# Patient Record
Sex: Female | Born: 2000 | Race: White | Hispanic: No | Marital: Single | Smoking: Never smoker
Health system: Southern US, Community
[De-identification: ages and names within clinical notes are randomized; demographics above are authoritative.]

## PROBLEM LIST (undated history)

## (undated) DIAGNOSIS — F909 Attention-deficit hyperactivity disorder, unspecified type: Secondary | ICD-10-CM

## (undated) HISTORY — PX: ULNAR NERVE TRANSPOSITION: SHX2595

## (undated) HISTORY — DX: Attention-deficit hyperactivity disorder, unspecified type: F90.9

## (undated) HISTORY — PX: KNEE SURGERY: SHX244

## (undated) HISTORY — PX: TONSILLECTOMY: SUR1361

---
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
## 2021-01-27 ENCOUNTER — Other Ambulatory Visit: Payer: Self-pay

## 2021-01-27 DIAGNOSIS — Z68.41 Body mass index (BMI) pediatric, 5th percentile to less than 85th percentile for age: Secondary | ICD-10-CM | POA: Diagnosis not present

## 2021-01-27 DIAGNOSIS — F902 Attention-deficit hyperactivity disorder, combined type: Secondary | ICD-10-CM | POA: Diagnosis not present

## 2021-01-27 MED ORDER — AMPHETAMINE-DEXTROAMPHET ER 20 MG PO CP24
20.0000 mg | ORAL_CAPSULE | Freq: Every morning | ORAL | 0 refills | Status: DC
  Filled 2021-01-27: qty 30, 30d supply, fill #0

## 2021-05-10 ENCOUNTER — Other Ambulatory Visit: Payer: Self-pay

## 2021-05-10 MED ORDER — AMPHETAMINE-DEXTROAMPHET ER 20 MG PO CP24
ORAL_CAPSULE | ORAL | 0 refills | Status: DC
  Filled 2021-05-10: qty 30, 30d supply, fill #0

## 2021-05-11 ENCOUNTER — Other Ambulatory Visit: Payer: Self-pay

## 2021-07-19 ENCOUNTER — Other Ambulatory Visit: Payer: Self-pay

## 2021-07-19 DIAGNOSIS — Z68.41 Body mass index (BMI) pediatric, 85th percentile to less than 95th percentile for age: Secondary | ICD-10-CM | POA: Diagnosis not present

## 2021-07-19 DIAGNOSIS — F902 Attention-deficit hyperactivity disorder, combined type: Secondary | ICD-10-CM | POA: Diagnosis not present

## 2021-07-19 DIAGNOSIS — Z23 Encounter for immunization: Secondary | ICD-10-CM | POA: Diagnosis not present

## 2021-07-19 MED ORDER — AMPHETAMINE-DEXTROAMPHET ER 20 MG PO CP24
ORAL_CAPSULE | ORAL | 0 refills | Status: DC
  Filled 2021-07-19: qty 30, 30d supply, fill #0

## 2021-08-19 DIAGNOSIS — J069 Acute upper respiratory infection, unspecified: Secondary | ICD-10-CM | POA: Diagnosis not present

## 2021-08-19 DIAGNOSIS — R059 Cough, unspecified: Secondary | ICD-10-CM | POA: Diagnosis not present

## 2021-08-19 DIAGNOSIS — J029 Acute pharyngitis, unspecified: Secondary | ICD-10-CM | POA: Diagnosis not present

## 2021-08-19 DIAGNOSIS — U071 COVID-19: Secondary | ICD-10-CM | POA: Diagnosis not present

## 2021-09-07 DIAGNOSIS — H5213 Myopia, bilateral: Secondary | ICD-10-CM | POA: Diagnosis not present

## 2021-10-06 ENCOUNTER — Other Ambulatory Visit: Payer: Self-pay

## 2021-10-06 MED ORDER — AMPHETAMINE-DEXTROAMPHET ER 20 MG PO CP24
ORAL_CAPSULE | ORAL | 0 refills | Status: DC
  Filled 2021-10-06: qty 30, 30d supply, fill #0

## 2021-10-07 DIAGNOSIS — Z68.41 Body mass index (BMI) pediatric, 85th percentile to less than 95th percentile for age: Secondary | ICD-10-CM | POA: Diagnosis not present

## 2021-10-07 DIAGNOSIS — Z0000 Encounter for general adult medical examination without abnormal findings: Secondary | ICD-10-CM | POA: Diagnosis not present

## 2021-10-07 DIAGNOSIS — F909 Attention-deficit hyperactivity disorder, unspecified type: Secondary | ICD-10-CM | POA: Diagnosis not present

## 2021-10-07 DIAGNOSIS — Z23 Encounter for immunization: Secondary | ICD-10-CM | POA: Diagnosis not present

## 2021-10-07 DIAGNOSIS — Z713 Dietary counseling and surveillance: Secondary | ICD-10-CM | POA: Diagnosis not present

## 2021-10-07 DIAGNOSIS — Z113 Encounter for screening for infections with a predominantly sexual mode of transmission: Secondary | ICD-10-CM | POA: Diagnosis not present

## 2022-01-02 ENCOUNTER — Other Ambulatory Visit: Payer: Self-pay

## 2022-01-02 MED ORDER — AMPHETAMINE-DEXTROAMPHET ER 20 MG PO CP24
ORAL_CAPSULE | ORAL | 0 refills | Status: AC
  Filled 2022-01-02: qty 30, 30d supply, fill #0

## 2022-01-10 ENCOUNTER — Other Ambulatory Visit: Payer: Self-pay

## 2022-01-10 DIAGNOSIS — F902 Attention-deficit hyperactivity disorder, combined type: Secondary | ICD-10-CM | POA: Diagnosis not present

## 2022-01-10 DIAGNOSIS — N62 Hypertrophy of breast: Secondary | ICD-10-CM | POA: Diagnosis not present

## 2022-01-10 DIAGNOSIS — Z68.41 Body mass index (BMI) pediatric, 5th percentile to less than 85th percentile for age: Secondary | ICD-10-CM | POA: Diagnosis not present

## 2022-02-06 ENCOUNTER — Encounter: Payer: Self-pay | Admitting: Plastic Surgery

## 2022-02-06 ENCOUNTER — Ambulatory Visit: Payer: 59 | Admitting: Plastic Surgery

## 2022-02-06 DIAGNOSIS — M546 Pain in thoracic spine: Secondary | ICD-10-CM | POA: Diagnosis not present

## 2022-02-06 DIAGNOSIS — M545 Low back pain, unspecified: Secondary | ICD-10-CM | POA: Diagnosis not present

## 2022-02-06 DIAGNOSIS — N62 Hypertrophy of breast: Secondary | ICD-10-CM

## 2022-02-06 DIAGNOSIS — Z6826 Body mass index (BMI) 26.0-26.9, adult: Secondary | ICD-10-CM | POA: Diagnosis not present

## 2022-02-06 DIAGNOSIS — M542 Cervicalgia: Secondary | ICD-10-CM | POA: Diagnosis not present

## 2022-02-06 DIAGNOSIS — Z803 Family history of malignant neoplasm of breast: Secondary | ICD-10-CM | POA: Diagnosis not present

## 2022-02-06 DIAGNOSIS — M549 Dorsalgia, unspecified: Secondary | ICD-10-CM | POA: Insufficient documentation

## 2022-02-06 DIAGNOSIS — R21 Rash and other nonspecific skin eruption: Secondary | ICD-10-CM

## 2022-02-06 DIAGNOSIS — G8929 Other chronic pain: Secondary | ICD-10-CM

## 2022-02-06 NOTE — Progress Notes (Signed)
Patient ID: Sierra Gilmore, female    DOB: 02-23-2001, 21 y.o.   MRN: [id number]   Chief Complaint  Patient presents with   Advice Only   Breast Problem    Mammary Hyperplasia: The patient is a 21 y.o. female with a history of mammary hyperplasia for several years.  She has extremely large breasts causing symptoms that include the following: Back pain in the upper and lower back, including neck pain. She pulls or pins her bra straps to provide better lift and relief of the pressure and pain. She notices relief by holding her breast up manually.  Her shoulder straps cause grooves and pain and pressure that requires padding for relief. Pain medication is sometimes required with motrin and tylenol.  Activities that are hindered by enlarged breasts include: exercise and running.  She has tried supportive clothing as well as fitted bras without improvement.  Her breasts are extremely large and fairly symmetric with the right slightly smaller.  She has hyperpigmentation of the inframammary area on both sides.  The sternal to nipple distance on the right is 30 cm and the left is 32 cm.  The IMF distance is 17 cm.  She is 5 feet 6 inches tall and weighs 166 pounds.  The BMI = 26.8 kg/m.  Preoperative bra size = I cup. She would like to be a C cup.  The estimated excess breast tissue to be removed at the time of surgery = 450-480 grams on the left and 450-480 grams on the right.  Mammogram history: none.  Family history of breast cancer:  p. grandmother.  Tobacco use:  none.   The patient expresses the desire to pursue surgical intervention.    Review of Systems  Constitutional: Negative.   HENT: Negative.    Eyes: Negative.   Respiratory: Negative.    Cardiovascular: Negative.   Gastrointestinal: Negative.   Endocrine: Negative.   Genitourinary: Negative.   Musculoskeletal:  Positive for back pain and neck pain.  Skin:  Positive for rash.  Neurological: Negative.   Hematological: Negative.    Psychiatric/Behavioral: Negative.      Past Medical History:  Diagnosis Date   ADHD (attention deficit hyperactivity disorder)     History reviewed. No pertinent surgical history.    Current Outpatient Medications:    amphetamine-dextroamphetamine (ADDERALL XR) 20 MG 24 hr capsule, Take 1 cap by mouth every morning for 30 days, Disp: 30 capsule, Rfl: 0   amphetamine-dextroamphetamine (ADDERALL XR) 20 MG 24 hr capsule, TAKE 1 CAPSULE BY MOUTH ONCE DAILY EVERY MORNING, Disp: 30 capsule, Rfl: 0   Objective:   Vitals:   02/06/22 1559  BP: 119/81  Pulse: 66  SpO2: 99%    Physical Exam Vitals and nursing note reviewed.  Constitutional:      Appearance: Normal appearance.  HENT:     Head: Normocephalic and atraumatic.  Cardiovascular:     Rate and Rhythm: Normal rate.     Pulses: Normal pulses.  Pulmonary:     Effort: Pulmonary effort is normal.  Abdominal:     General: There is no distension.     Palpations: Abdomen is soft.     Tenderness: There is no abdominal tenderness.  Musculoskeletal:        General: Tenderness present. No swelling or deformity.  Skin:    Capillary Refill: Capillary refill takes less than 2 seconds.     Coloration: Skin is not jaundiced or pale.     Findings: Rash  present. No bruising.  Neurological:     Mental Status: She is alert and oriented to person, place, and time.  Psychiatric:        Mood and Affect: Mood normal.        Behavior: Behavior normal.        Thought Content: Thought content normal.        Judgment: Judgment normal.     Assessment & Plan:  Symptomatic mammary hypertrophy  Neck pain  Chronic bilateral thoracic back pain  The procedure the patient selected and that was best for the patient was discussed. The risk were discussed and include but not limited to the following:  Breast asymmetry, fluid accumulation, firmness of the breast, inability to breast feed, loss of nipple or areola, skin loss, change in skin and  nipple sensation, fat necrosis of the breast tissue, bleeding, infection and healing delay.  There are risks of anesthesia and injury to nerves or blood vessels.  Allergic reaction to tape, suture and skin glue are possible.  There will be swelling.  Any of these can lead to the need for revisional surgery.  A breast reduction has potential to interfere with diagnostic procedures in the future.  This procedure is best done when the breast is fully developed.  Changes in the breast will continue to occur over time: pregnancy, weight gain or weigh loss.    Total time: 40 minutes. This includes time spent with the patient during the visit as well as time spent before and after the visit reviewing the chart, documenting the encounter, ordering pertinent studies and literature for the patient.    The patient is a good candidate for bilateral breast reduction with liposuction.   Pictures were obtained of the patient and placed in the chart with the patient's or guardian's permission.   Alena Bills Malayiah Mcbrayer, DO

## 2022-02-07 ENCOUNTER — Institutional Professional Consult (permissible substitution): Payer: 59 | Admitting: Plastic Surgery

## 2022-02-21 ENCOUNTER — Telehealth: Payer: Self-pay

## 2022-02-21 NOTE — Telephone Encounter (Signed)
Refaxed documents for surgery pre-authorization [phone]

## 2022-02-23 ENCOUNTER — Telehealth: Payer: Self-pay

## 2022-02-23 NOTE — Telephone Encounter (Signed)
Refaxed notes, photos to [facility] ref#[id number]

## 2022-03-08 ENCOUNTER — Telehealth: Payer: Self-pay | Admitting: Plastic Surgery

## 2022-03-08 NOTE — Telephone Encounter (Signed)
TRLVM NEED TO SCHEDULE SURGERY WAS APPROVED - [facility] NEEDS TO COORDINATE TIME OFF---POSS SEPT 18 OR 21 OR OCT 2??

## 2022-09-15 DIAGNOSIS — M25521 Pain in right elbow: Secondary | ICD-10-CM | POA: Diagnosis not present

## 2022-09-20 DIAGNOSIS — G5621 Lesion of ulnar nerve, right upper limb: Secondary | ICD-10-CM | POA: Diagnosis not present

## 2022-10-09 DIAGNOSIS — G5621 Lesion of ulnar nerve, right upper limb: Secondary | ICD-10-CM | POA: Diagnosis not present

## 2022-10-17 DIAGNOSIS — H6992 Unspecified Eustachian tube disorder, left ear: Secondary | ICD-10-CM | POA: Diagnosis not present

## 2022-10-17 DIAGNOSIS — J0140 Acute pansinusitis, unspecified: Secondary | ICD-10-CM | POA: Diagnosis not present

## 2022-10-19 DIAGNOSIS — M25521 Pain in right elbow: Secondary | ICD-10-CM | POA: Diagnosis not present

## 2022-10-26 DIAGNOSIS — G5621 Lesion of ulnar nerve, right upper limb: Secondary | ICD-10-CM | POA: Diagnosis not present

## 2022-10-31 ENCOUNTER — Encounter (HOSPITAL_BASED_OUTPATIENT_CLINIC_OR_DEPARTMENT_OTHER): Payer: Self-pay | Admitting: Orthopedic Surgery

## 2022-10-31 ENCOUNTER — Other Ambulatory Visit: Payer: Self-pay

## 2022-11-05 NOTE — H&P (Signed)
Preoperative History & Physical Exam  Surgeon: Philipp Ovens, MD  Diagnosis: Right recurrent cubital tunnel syndrome  Planned Procedure: Procedure(s) (LRB): RIGHT REVISION ULNAR NERVE TRANSPOSITION AND FLEXOR PRONATOR MASS LENGTHENING (Right)  History of Present Illness:   Patient is a 22 y.o. female with symptoms consistent with Right recurrent cubital tunnel syndrome who presents for surgical intervention. The risks, benefits and alternatives of surgical intervention were discussed and informed consent was obtained prior to surgery.  Past Medical History:  Past Medical History:  Diagnosis Date   ADHD (attention deficit hyperactivity disorder)     Past Surgical History:  Past Surgical History:  Procedure Laterality Date   KNEE SURGERY     TONSILLECTOMY     ULNAR NERVE TRANSPOSITION      Medications:  Prior to Admission medications   Medication Sig Start Date End Date Taking? Authorizing Provider  amphetamine-dextroamphetamine (ADDERALL XR) 20 MG 24 hr capsule Take 1 cap by mouth every morning for 30 days 01/02/22  Yes   amphetamine-dextroamphetamine (ADDERALL XR) 20 MG 24 hr capsule TAKE 1 CAPSULE BY MOUTH ONCE DAILY EVERY MORNING 07/29/20 01/25/21  Gildardo Pounds, MD    Allergies:  Patient has no known allergies.  Review of Systems: Negative except per HPI.  Physical Exam: Alert and oriented, NAD Head and neck: no masses, normal alignment CV: pulse intact Pulm: no increased work of breathing, respirations even and unlabored Abdomen: non-distended Extremities: extremities warm and well perfused  LABS: No results found for this or any previous visit (from the past 2160 hour(s)).   Complete History and Physical exam available in the office notes  Gomez Cleverly

## 2022-11-06 NOTE — Anesthesia Preprocedure Evaluation (Addendum)
Anesthesia Evaluation  Patient identified by MRN, date of birth, ID band Patient awake    Reviewed: Allergy & Precautions, H&P , NPO status , Patient's Chart, lab work & pertinent test results  Airway Mallampati: II  TM Distance: >3 FB Neck ROM: Full    Dental no notable dental hx. (+) Teeth Intact, Dental Advisory Given   Pulmonary neg pulmonary ROS   Pulmonary exam normal breath sounds clear to auscultation       Cardiovascular Exercise Tolerance: Good negative cardio ROS Normal cardiovascular exam Rhythm:Regular Rate:Normal     Neuro/Psych  PSYCHIATRIC DISORDERS      negative neurological ROS  negative psych ROS   GI/Hepatic negative GI ROS, Neg liver ROS,,,  Endo/Other  negative endocrine ROS    Renal/GU negative Renal ROS  negative genitourinary   Musculoskeletal negative musculoskeletal ROS (+)    Abdominal   Peds negative pediatric ROS (+)  Hematology negative hematology ROS (+)   Anesthesia Other Findings   Reproductive/Obstetrics negative OB ROS                             Anesthesia Physical Anesthesia Plan  ASA: 2  Anesthesia Plan: General   Post-op Pain Management: Minimal or no pain anticipated, Tylenol PO (pre-op)* and Celebrex PO (pre-op)*   Induction: Intravenous  PONV Risk Score and Plan: 3 and Ondansetron, Treatment may vary due to age or medical condition and Propofol infusion  Airway Management Planned: LMA  Additional Equipment: None  Intra-op Plan:   Post-operative Plan:   Informed Consent: I have reviewed the patients History and Physical, chart, labs and discussed the procedure including the risks, benefits and alternatives for the proposed anesthesia with the patient or authorized representative who has indicated his/her understanding and acceptance.       Plan Discussed with: Anesthesiologist and CRNA  Anesthesia Plan Comments:         Anesthesia Quick Evaluation

## 2022-11-07 ENCOUNTER — Ambulatory Visit (HOSPITAL_BASED_OUTPATIENT_CLINIC_OR_DEPARTMENT_OTHER): Payer: Commercial Managed Care - PPO | Admitting: Anesthesiology

## 2022-11-07 ENCOUNTER — Ambulatory Visit (HOSPITAL_BASED_OUTPATIENT_CLINIC_OR_DEPARTMENT_OTHER): Payer: Commercial Managed Care - PPO

## 2022-11-07 ENCOUNTER — Other Ambulatory Visit: Payer: Self-pay

## 2022-11-07 ENCOUNTER — Encounter (HOSPITAL_BASED_OUTPATIENT_CLINIC_OR_DEPARTMENT_OTHER)
Admission: RE | Disposition: A | Discharge status: Home / Self Care | Payer: Self-pay | Source: Home / Self Care | Attending: Orthopedic Surgery

## 2022-11-07 ENCOUNTER — Encounter (HOSPITAL_BASED_OUTPATIENT_CLINIC_OR_DEPARTMENT_OTHER): Payer: Self-pay | Admitting: Orthopedic Surgery

## 2022-11-07 ENCOUNTER — Ambulatory Visit (HOSPITAL_BASED_OUTPATIENT_CLINIC_OR_DEPARTMENT_OTHER)
Admission: RE | Admit: 2022-11-07 | Discharge: 2022-11-07 | Disposition: A | Discharge status: Home / Self Care | Payer: Commercial Managed Care - PPO | Attending: Orthopedic Surgery | Admitting: Orthopedic Surgery

## 2022-11-07 DIAGNOSIS — G5621 Lesion of ulnar nerve, right upper limb: Secondary | ICD-10-CM | POA: Diagnosis not present

## 2022-11-07 DIAGNOSIS — Z01818 Encounter for other preprocedural examination: Secondary | ICD-10-CM

## 2022-11-07 HISTORY — PX: ANTERIOR INTEROSSEOUS NERVE DECOMPRESSION: SHX5735

## 2022-11-07 LAB — POCT PREGNANCY, URINE: Preg Test, Ur: NEGATIVE

## 2022-11-07 SURGERY — ANTERIOR INTEROSSEOUS NERVE DECOMPRESSION
Anesthesia: General | Site: Arm Upper | Laterality: Right

## 2022-11-07 MED ORDER — ACETAMINOPHEN 160 MG/5ML PO SOLN: 325.0000 mg | ORAL | Status: DC | PRN

## 2022-11-07 MED ORDER — MIDAZOLAM HCL 2 MG/2ML IJ SOLN
INTRAMUSCULAR | Status: AC
  Filled 2022-11-07: qty 2

## 2022-11-07 MED ORDER — DEXMEDETOMIDINE HCL IN NACL 80 MCG/20ML IV SOLN
INTRAVENOUS | Status: DC | PRN
  Administered 2022-11-07: 8 ug via BUCCAL

## 2022-11-07 MED ORDER — PROPOFOL 500 MG/50ML IV EMUL
INTRAVENOUS | Status: DC | PRN
  Administered 2022-11-07: 100 ug/kg/min via INTRAVENOUS

## 2022-11-07 MED ORDER — CELECOXIB 200 MG PO CAPS
200.0000 mg | ORAL_CAPSULE | Freq: Once | ORAL | Status: AC
  Administered 2022-11-07: 200 mg via ORAL

## 2022-11-07 MED ORDER — DEXAMETHASONE SODIUM PHOSPHATE 10 MG/ML IJ SOLN
INTRAMUSCULAR | Status: AC
  Filled 2022-11-07: qty 2

## 2022-11-07 MED ORDER — FENTANYL CITRATE (PF) 100 MCG/2ML IJ SOLN: 25.0000 ug | INTRAMUSCULAR | Status: DC | PRN

## 2022-11-07 MED ORDER — HYDROCODONE-ACETAMINOPHEN 5-325 MG PO TABS
1.0000 | ORAL_TABLET | Freq: Four times a day (QID) | ORAL | 0 refills | Status: AC | PRN
  Filled 2022-11-07: qty 8, 2d supply, fill #0

## 2022-11-07 MED ORDER — 0.9 % SODIUM CHLORIDE (POUR BTL) OPTIME
TOPICAL | Status: DC | PRN
  Administered 2022-11-07: 200 mL

## 2022-11-07 MED ORDER — BUPIVACAINE LIPOSOME 1.3 % IJ SUSP
INTRAMUSCULAR | Status: DC | PRN
  Administered 2022-11-07: 10 mL via PERINEURAL

## 2022-11-07 MED ORDER — ACETAMINOPHEN 325 MG PO TABS: 325.0000 mg | ORAL_TABLET | ORAL | Status: DC | PRN

## 2022-11-07 MED ORDER — LIDOCAINE-EPINEPHRINE 1 %-1:100000 IJ SOLN
INTRAMUSCULAR | Status: AC
  Filled 2022-11-07: qty 1

## 2022-11-07 MED ORDER — OXYCODONE HCL 5 MG/5ML PO SOLN: 5.0000 mg | Freq: Once | ORAL | Status: DC | PRN

## 2022-11-07 MED ORDER — FENTANYL CITRATE (PF) 100 MCG/2ML IJ SOLN
INTRAMUSCULAR | Status: AC
  Filled 2022-11-07: qty 2

## 2022-11-07 MED ORDER — BACITRACIN ZINC 500 UNIT/GM EX OINT
TOPICAL_OINTMENT | CUTANEOUS | Status: DC | PRN
  Administered 2022-11-07: 1 via TOPICAL

## 2022-11-07 MED ORDER — FENTANYL CITRATE (PF) 100 MCG/2ML IJ SOLN
100.0000 ug | Freq: Once | INTRAMUSCULAR | Status: AC
  Administered 2022-11-07: 100 ug via INTRAVENOUS

## 2022-11-07 MED ORDER — LIDOCAINE 2% (20 MG/ML) 5 ML SYRINGE
INTRAMUSCULAR | Status: DC | PRN
  Administered 2022-11-07: 30 mg via INTRAVENOUS

## 2022-11-07 MED ORDER — OXYCODONE HCL 5 MG PO TABS: 5.0000 mg | ORAL_TABLET | Freq: Once | ORAL | Status: DC | PRN

## 2022-11-07 MED ORDER — ONDANSETRON HCL 4 MG/2ML IJ SOLN
INTRAMUSCULAR | Status: AC
  Filled 2022-11-07: qty 10

## 2022-11-07 MED ORDER — LACTATED RINGERS IV SOLN: INTRAVENOUS | Status: DC

## 2022-11-07 MED ORDER — CEFAZOLIN SODIUM-DEXTROSE 2-4 GM/100ML-% IV SOLN
2.0000 g | INTRAVENOUS | Status: AC
  Administered 2022-11-07: 2 g via INTRAVENOUS

## 2022-11-07 MED ORDER — ACETAMINOPHEN 500 MG PO TABS
1000.0000 mg | ORAL_TABLET | Freq: Once | ORAL | Status: AC
  Administered 2022-11-07: 1000 mg via ORAL

## 2022-11-07 MED ORDER — ACETAMINOPHEN 500 MG PO TABS
ORAL_TABLET | ORAL | Status: AC
  Filled 2022-11-07: qty 2

## 2022-11-07 MED ORDER — CEFAZOLIN SODIUM-DEXTROSE 2-4 GM/100ML-% IV SOLN
INTRAVENOUS | Status: AC
  Filled 2022-11-07: qty 100

## 2022-11-07 MED ORDER — PROPOFOL 10 MG/ML IV BOLUS
INTRAVENOUS | Status: DC | PRN
  Administered 2022-11-07: 20 mg via INTRAVENOUS

## 2022-11-07 MED ORDER — BUPIVACAINE HCL (PF) 0.5 % IJ SOLN
INTRAMUSCULAR | Status: DC | PRN
  Administered 2022-11-07: 10 mL via PERINEURAL

## 2022-11-07 MED ORDER — ONDANSETRON HCL 4 MG/2ML IJ SOLN
INTRAMUSCULAR | Status: AC
  Filled 2022-11-07: qty 2

## 2022-11-07 MED ORDER — ONDANSETRON HCL 4 MG/2ML IJ SOLN: 4.0000 mg | Freq: Once | INTRAMUSCULAR | Status: DC | PRN

## 2022-11-07 MED ORDER — BUPIVACAINE HCL (PF) 0.5 % IJ SOLN
INTRAMUSCULAR | Status: AC
  Filled 2022-11-07: qty 30

## 2022-11-07 MED ORDER — BACITRACIN ZINC 500 UNIT/GM EX OINT
TOPICAL_OINTMENT | CUTANEOUS | Status: AC
  Filled 2022-11-07: qty 28.35

## 2022-11-07 MED ORDER — LIDOCAINE 2% (20 MG/ML) 5 ML SYRINGE
INTRAMUSCULAR | Status: AC
  Filled 2022-11-07: qty 20

## 2022-11-07 MED ORDER — MIDAZOLAM HCL 2 MG/2ML IJ SOLN
2.0000 mg | Freq: Once | INTRAMUSCULAR | Status: AC
  Administered 2022-11-07: 2 mg via INTRAVENOUS

## 2022-11-07 MED ORDER — PROPOFOL 500 MG/50ML IV EMUL
INTRAVENOUS | Status: AC
  Filled 2022-11-07: qty 50

## 2022-11-07 MED ORDER — MIDAZOLAM HCL 5 MG/5ML IJ SOLN
INTRAMUSCULAR | Status: DC | PRN
  Administered 2022-11-07: 1 mg via INTRAVENOUS

## 2022-11-07 MED ORDER — MEPERIDINE HCL 25 MG/ML IJ SOLN: 6.2500 mg | INTRAMUSCULAR | Status: DC | PRN

## 2022-11-07 MED ORDER — FENTANYL CITRATE (PF) 100 MCG/2ML IJ SOLN
INTRAMUSCULAR | Status: DC | PRN
  Administered 2022-11-07: 25 ug via INTRAVENOUS

## 2022-11-07 MED ORDER — CELECOXIB 200 MG PO CAPS
ORAL_CAPSULE | ORAL | Status: AC
  Filled 2022-11-07: qty 1

## 2022-11-07 SURGICAL SUPPLY — 46 items
BLADE SURG 15 STRL LF DISP TIS (BLADE) ×1 IMPLANT
BLADE SURG 15 STRL SS (BLADE) ×1
BNDG CMPR 5X3 KNIT ELC UNQ LF (GAUZE/BANDAGES/DRESSINGS) ×1
BNDG CMPR 9X4 STRL LF SNTH (GAUZE/BANDAGES/DRESSINGS) ×1
BNDG ELASTIC 3INX 5YD STR LF (GAUZE/BANDAGES/DRESSINGS) ×2 IMPLANT
BNDG ELASTIC 4X5.8 VLCR STR LF (GAUZE/BANDAGES/DRESSINGS) IMPLANT
BNDG ESMARK 4X9 LF (GAUZE/BANDAGES/DRESSINGS) ×1 IMPLANT
BRUSH SCRUB EZ PLAIN DRY (MISCELLANEOUS) ×1 IMPLANT
CORD BIPOLAR FORCEPS 12FT (ELECTRODE) ×1 IMPLANT
COVER BACK TABLE 60X90IN (DRAPES) ×1 IMPLANT
CUFF TOURN SGL QUICK 18X4 (TOURNIQUET CUFF) ×1 IMPLANT
DRAPE EXTREMITY T 121X128X90 (DISPOSABLE) ×1 IMPLANT
DRAPE SURG 17X23 STRL (DRAPES) ×1 IMPLANT
DRSG ADAPTIC 3X8 NADH LF (GAUZE/BANDAGES/DRESSINGS) IMPLANT
GAUZE SPONGE 4X4 12PLY STRL (GAUZE/BANDAGES/DRESSINGS) ×1 IMPLANT
GLOVE BIO SURGEON STRL SZ 6 (GLOVE) IMPLANT
GLOVE BIOGEL PI IND STRL 6.5 (GLOVE) IMPLANT
GLOVE BIOGEL PI IND STRL 7.5 (GLOVE) ×1 IMPLANT
GOWN STRL REUS W/ TWL LRG LVL3 (GOWN DISPOSABLE) ×1 IMPLANT
GOWN STRL REUS W/TWL LRG LVL3 (GOWN DISPOSABLE) ×1 IMPLANT
GOWN STRL REUS W/TWL XL LVL3 (GOWN DISPOSABLE) ×1 IMPLANT
LOOP VASCLR MAXI BLUE 18IN ST (MISCELLANEOUS) ×1 IMPLANT
LOOP VASCULAR MAXI 18 BLUE (MISCELLANEOUS) ×1
LOOPS VASCLR MAXI BLUE 18IN ST (MISCELLANEOUS) ×1 IMPLANT
NDL HYPO 22X1.5 SAFETY MO (MISCELLANEOUS) IMPLANT
NEEDLE HYPO 22X1.5 SAFETY MO (MISCELLANEOUS) ×1 IMPLANT
NS IRRIG 1000ML POUR BTL (IV SOLUTION) ×1 IMPLANT
PACK BASIN DAY SURGERY FS (CUSTOM PROCEDURE TRAY) ×1 IMPLANT
PAD CAST 4YDX4 CTTN HI CHSV (CAST SUPPLIES) ×1 IMPLANT
PADDING CAST COTTON 4X4 STRL (CAST SUPPLIES) ×1
PADDING CAST SYNTHETIC 4X4 STR (CAST SUPPLIES) IMPLANT
SHEET MEDIUM DRAPE 40X70 STRL (DRAPES) ×1 IMPLANT
SLING ARM FOAM STRAP LRG (SOFTGOODS) IMPLANT
SPLINT FIBERGLASS 4X30 (CAST SUPPLIES) IMPLANT
SUT ETHIBOND 2-0 V-5 NDL (SUTURE) IMPLANT
SUT ETHIBOND 2-0 V-5 NEEDLE (SUTURE) ×1 IMPLANT
SUT ETHILON 4 0 PS 2 18 (SUTURE) ×1 IMPLANT
SUT FIBERWIRE 2-0 18 17.9 3/8 (SUTURE)
SUT VIC AB 3-0 FS2 27 (SUTURE) IMPLANT
SUTURE FIBERWR 2-0 18 17.9 3/8 (SUTURE) IMPLANT
SYR 10ML LL (SYRINGE) ×1 IMPLANT
SYR BULB EAR ULCER 3OZ GRN STR (SYRINGE) ×1 IMPLANT
TAPE SURG TRANSPORE 1 IN (GAUZE/BANDAGES/DRESSINGS) ×1 IMPLANT
TOWEL GREEN STERILE FF (TOWEL DISPOSABLE) ×2 IMPLANT
UNDERPAD 30X36 HEAVY ABSORB (UNDERPADS AND DIAPERS) ×1 IMPLANT
VASCULAR TIE MAXI BLUE 18IN ST (MISCELLANEOUS) ×1

## 2022-11-07 NOTE — Interval H&P Note (Signed)
History and Physical Interval Note:  11/07/2022 7:11 AM  Sierra Gilmore  has presented today for surgery, with the diagnosis of Right recurrent cubital tunnel syndrome.  The various methods of treatment have been discussed with the patient and family. After consideration of risks, benefits and other options for treatment, the patient has consented to  Procedure(s): RIGHT REVISION ULNAR NERVE TRANSPOSITION AND FLEXOR PRONATOR MASS LENGTHENING (Right) as a surgical intervention.  The patient's history has been reviewed, patient examined, no change in status, stable for surgery.  I have reviewed the patient's chart and labs.  Questions were answered to the patient's satisfaction.     Gomez Cleverly

## 2022-11-07 NOTE — Transfer of Care (Signed)
Immediate Anesthesia Transfer of Care Note  Patient: Sierra Gilmore  Procedure(s) Performed: RIGHT REVISION ULNAR NERVE TRANSPOSITION AND FLEXOR PRONATOR MASS LENGTHENING (Right: Arm Upper)  Patient Location: PACU  Anesthesia Type:MAC combined with regional for post-op pain  Level of Consciousness: drowsy and patient cooperative  Airway & Oxygen Therapy: Patient Spontanous Breathing and Patient connected to face mask oxygen  Post-op Assessment: Report given to RN and Post -op Vital signs reviewed and stable  Post vital signs: Reviewed and stable  Last Vitals:  Vitals Value Taken Time  BP    Temp    Pulse 70 11/07/22 0837  Resp    SpO2 97 % 11/07/22 0837  Vitals shown include unvalidated device data.  Last Pain:  Vitals:   11/07/22 0641  TempSrc: Oral  PainSc: 0-No pain      Patients Stated Pain Goal: 4 (11/07/22 0641)  Complications: No notable events documented.

## 2022-11-07 NOTE — Discharge Instructions (Addendum)
Orthopaedic Hand Surgery Discharge Instructions  WEIGHT BEARING STATUS: Non weight bearing on operative extremity  DRESSING CARE: Please keep your dressing/splint/cast clean and dry until your follow-up appointment. You may shower by placing a waterproof covering over your dressing/splint/cast. Contact your surgeon if your splint/cast gets wet. It will need to be changed to prevent skin breakdown.  PAIN CONTROL: First line medications for post operative pain control are Tylenol (acetaminophen) and Motrin (ibuprofen) if you are able to take these medications. If you have been prescribed a medication these can be taken as breakthrough pain medications. Please note that some narcotic pain medication has acetaminophen added and you should never consume more than 4,000mg  of acetaminophen in 24-hour period. Please note that if you are given Toradol (ketorolac) you should not take similar medications such as ibuprofen or naproxen.  DISCHARGE MEDICATIONS: If you have been prescribed medication it was sent electronically to your pharmacy. No changes have been made to your home medications.  ICE/ELEVATION: Ice and elevate your injured extremity as needed. Avoid direct contact of ice with skin.   BANDAGE FEELS TOO TIGHT: If your bandage feels too tight, first make sure you are elevating your fingers as much as possible. The outer layer of the bandage can be unwrapped and reapplied more loosely. If no improvement, you may carefully cut the inner layer longitudinally until the pressure has resolved and then rewrap the outer layer. If you are not comfortable with these instructions, please call the office and the bandage can be changed for you.   FOLLOW UP: You will be called after surgery with an appointment date and time, however if you have not received a phone call within 3 days, please call during regular office hours at [phone] to schedule a post operative appointment.  Please Seek Medical Attention  if: Call MD for: pain or pressure in chest, jaw, arm, back, neck  Call MD for: temperature greater than 101 F for more than 24 hrs Call MD for: difficulty breathing Call MD for: incision redness, bleeding, drainage  Call MD for: palpitations or feeling that the heart is racing  Call MD for: increased swelling in arm, leg, ankle, or abdomen  Call MD for: lightheadedness, dizziness, fainting Call 911 or go to ER for any medical emergency if you are not able to get in touch with your doctor   J. Standley Dakins, MD Orthopaedic Hand Surgeon [facility] Office number: [phone] [address], [address] [place], [place] [address]   You may have Tylenol after 12:45pm today, if needed. You may have Ibuprofen/NSAIDS after 12:45pm today, if needed.   Post Anesthesia Home Care Instructions  Activity: Get plenty of rest for the remainder of the day. A responsible individual must stay with you for 24 hours following the procedure.  For the next 24 hours, DO NOT: -Drive a car -Advertising copywriter -Drink alcoholic beverages -Take any medication unless instructed by your physician -Make any legal decisions or sign important papers.  Meals: Start with liquid foods such as gelatin or soup. Progress to regular foods as tolerated. Avoid greasy, spicy, heavy foods. If nausea and/or vomiting occur, drink only clear liquids until the nausea and/or vomiting subsides. Call your physician if vomiting continues.  Special Instructions/Symptoms: Your throat may feel dry or sore from the anesthesia or the breathing tube placed in your throat during surgery. If this causes discomfort, gargle with warm salt water. The discomfort should disappear within 24 hours.  If you had a scopolamine patch placed behind your ear  for the management of post- operative nausea and/or vomiting:  1. The medication in the patch is effective for 72 hours, after which it should be removed.  Wrap patch in a tissue and discard in  the trash. Wash hands thoroughly with soap and water. 2. You may remove the patch earlier than 72 hours if you experience unpleasant side effects which may include dry mouth, dizziness or visual disturbances. 3. Avoid touching the patch. Wash your hands with soap and water after contact with the patch.   Regional Anesthesia Blocks  1. Numbness or the inability to move the "blocked" extremity may last from 3-48 hours after placement. The length of time depends on the medication injected and your individual response to the medication. If the numbness is not going away after 48 hours, call your surgeon.  2. The extremity that is blocked will need to be protected until the numbness is gone and the  Strength has returned. Because you cannot feel it, you will need to take extra care to avoid injury. Because it may be weak, you may have difficulty moving it or using it. You may not know what position it is in without looking at it while the block is in effect.  3. For blocks in the legs and feet, returning to weight bearing and walking needs to be done carefully. You will need to wait until the numbness is entirely gone and the strength has returned. You should be able to move your leg and foot normally before you try and bear weight or walk. You will need someone to be with you when you first try to ensure you do not fall and possibly risk injury.  4. Bruising and tenderness at the needle site are common side effects and will resolve in a few days.  5. Persistent numbness or new problems with movement should be communicated to the surgeon or the [facility] [phone] [facility] [phone]).

## 2022-11-07 NOTE — Op Note (Signed)
OPERATIVE NOTE  DATE OF PROCEDURE: 11/07/2022  SURGEONS:  Primary: Gomez Cleverly, MD  PREOPERATIVE DIAGNOSIS: Right recurrent cubital tunnel syndrome  POSTOPERATIVE DIAGNOSIS: Same  NAME OF PROCEDURE:   Right elbow revision cubital tunnel release with ulnar nerve transmuscular transposition with flexor pronator mass tendon lengthening  ANESTHESIA: Monitor Anesthesia Care + Regional  SKIN PREPARATION: Hibiclens  ESTIMATED BLOOD LOSS: Minimal  IMPLANTS: none  INDICATIONS:  Sierra Gilmore is a 22 y.o. female who has the above preoperative diagnosis. The patient has decided to proceed with surgical intervention.  Risks, benefits and alternatives of operative management were discussed including, but not limited to, risks of anesthesia complications, infection, pain, persistent symptoms, stiffness, need for future surgery.  The patient understands, agrees and elects to proceed with surgery.    DESCRIPTION OF PROCEDURE: The patient was met in the pre-operative area and their identity was verified.  The operative location and laterality was also verified and marked.  The patient was brought to the OR and was placed supine on the table.  After repeat patient identification with the operative team anesthesia was provided and the patient was prepped and draped in the usual sterile fashion.  A final timeout was performed verifying the correction patient, procedure, location and laterality.  Preoperative antibiotics were provided and then the right upper extremity was elevated exsanguinated with an Esmarch and tourniquet inflated to 250 mmHg.  The previous surgical scar was identified and it was extended distally it was sufficient proximally.  Skin and subtenons tissues were divided and careful hemostasis was obtained care was taken to protect bridging veins and medial antebrachial cutaneous nerve branches.  The ulnar nerve was identified and was surrounded and robust scar tissue.  Native ends of the nerve both  proximally and distally were identified and were traced back to the center portion where the nerve was kinked at the flexor carpi ulnaris deep fascia fibers and within abundant scar tissue.  The scar was excised the nerve was thoroughly decompressed both proximally and distally.  The nerve was then mobilized and was transposed in an anterior position.  Z-lengthening of the flexor pronator mass tendons was performed and the fascial flaps were raised.  The Z-lengthening was performed by securing these fascial flaps in a loose manner with a 2-0 Ethibond suture.  The elbow was brought through full range of motion and the nerve was in a straight line under no tension in an anterior position.  The wound was thoroughly irrigated and closed in layers with 3-0 Vicryl suture followed by 4-0 nylon suture.  A sterile soft bandage was applied and long-arm splint was applied.  The tourniquet was deflated and the fingers were pink and warm and well-perfused at the end of the case with brisk cap refill.  The patient was awoken from anesthesia and brought to PACU for recovery in stable condition.  All counts were correct x 2.   Philipp Ovens, MD

## 2022-11-07 NOTE — Anesthesia Postprocedure Evaluation (Signed)
Anesthesia Post Note  Patient: Sierra Gilmore  Procedure(s) Performed: RIGHT REVISION ULNAR NERVE TRANSPOSITION AND FLEXOR PRONATOR MASS LENGTHENING (Right: Arm Upper)     Patient location during evaluation: PACU Anesthesia Type: General Level of consciousness: awake and alert Pain management: pain level controlled Vital Signs Assessment: post-procedure vital signs reviewed and stable Respiratory status: spontaneous breathing, nonlabored ventilation, respiratory function stable and patient connected to nasal cannula oxygen Cardiovascular status: blood pressure returned to baseline and stable Postop Assessment: no apparent nausea or vomiting Anesthetic complications: no  No notable events documented.  Last Vitals:  Vitals:   11/07/22 0900 11/07/22 0913  BP: 114/85 (!) 114/91  Pulse: 76 73  Resp: 20   Temp:  (!) 36.4 C  SpO2: 94% 96%    Last Pain:  Vitals:   11/07/22 0913  TempSrc: Oral  PainSc: 0-No pain                 Pastor Sgro

## 2022-11-07 NOTE — Anesthesia Procedure Notes (Signed)
Procedure Name: MAC Date/Time: 11/07/2022 7:47 AM  Performed by: Sheryn Bison, CRNAPre-anesthesia Checklist: Patient identified, Emergency Drugs available, Suction available, Patient being monitored and Timeout performed Patient Re-evaluated:Patient Re-evaluated prior to induction Oxygen Delivery Method: Simple face mask

## 2022-11-07 NOTE — Progress Notes (Signed)
Assisted Dr. Oddono with right, supraclavicular, ultrasound guided block. Side rails up, monitors on throughout procedure. See vital signs in flow sheet. Tolerated Procedure well. 

## 2022-11-07 NOTE — Anesthesia Procedure Notes (Signed)
Anesthesia Regional Block: Supraclavicular block   Pre-Anesthetic Checklist: , timeout performed,  Correct Patient, Correct Site, Correct Laterality,  Correct Procedure, Correct Position, site marked,  Risks and benefits discussed,  Surgical consent,  Pre-op evaluation,  At surgeon's request and post-op pain management  Laterality: Right  Prep: chloraprep       Needles:  Injection technique: Single-shot  Needle Type: Echogenic Stimulator Needle     Needle Length: 5cm  Needle Gauge: 22     Additional Needles:   Procedures:, nerve stimulator,,, ultrasound used (permanent image in chart),,     Nerve Stimulator or Paresthesia:  Response: hand, 0.45 mA  Additional Responses:   Narrative:  Start time: 11/07/2022 6:45 AM End time: 11/07/2022 7:00 AM Injection made incrementally with aspirations every 5 mL.  Performed by: Personally  Anesthesiologist: Bethena Midget, MD  Additional Notes: Functioning IV was confirmed and monitors were applied.  A 7mm 22ga Arrow echogenic stimulator needle was used. Sterile prep and drape,hand hygiene and sterile gloves were used. Ultrasound guidance: relevant anatomy identified, needle position confirmed, local anesthetic spread visualized around nerve(s)., vascular puncture avoided.  Image printed for medical record. Negative aspiration and negative test dose prior to incremental administration of local anesthetic. The patient tolerated the procedure well.

## 2022-11-08 ENCOUNTER — Encounter (HOSPITAL_BASED_OUTPATIENT_CLINIC_OR_DEPARTMENT_OTHER): Payer: Self-pay | Admitting: Orthopedic Surgery

## 2022-11-14 DIAGNOSIS — M25521 Pain in right elbow: Secondary | ICD-10-CM | POA: Diagnosis not present

## 2022-11-24 DIAGNOSIS — M25521 Pain in right elbow: Secondary | ICD-10-CM | POA: Diagnosis not present

## 2022-11-28 DIAGNOSIS — M25521 Pain in right elbow: Secondary | ICD-10-CM | POA: Diagnosis not present

## 2022-12-05 DIAGNOSIS — M25521 Pain in right elbow: Secondary | ICD-10-CM | POA: Diagnosis not present

## 2022-12-12 DIAGNOSIS — M25521 Pain in right elbow: Secondary | ICD-10-CM | POA: Diagnosis not present

## 2023-01-05 DIAGNOSIS — Z23 Encounter for immunization: Secondary | ICD-10-CM | POA: Diagnosis not present

## 2023-01-05 DIAGNOSIS — Z1330 Encounter for screening examination for mental health and behavioral disorders, unspecified: Secondary | ICD-10-CM | POA: Diagnosis not present

## 2023-01-05 DIAGNOSIS — Z30017 Encounter for initial prescription of implantable subdermal contraceptive: Secondary | ICD-10-CM | POA: Diagnosis not present

## 2023-01-05 DIAGNOSIS — Z713 Dietary counseling and surveillance: Secondary | ICD-10-CM | POA: Diagnosis not present

## 2023-01-05 DIAGNOSIS — F909 Attention-deficit hyperactivity disorder, unspecified type: Secondary | ICD-10-CM | POA: Diagnosis not present

## 2023-01-05 DIAGNOSIS — Z0000 Encounter for general adult medical examination without abnormal findings: Secondary | ICD-10-CM | POA: Diagnosis not present

## 2023-01-05 DIAGNOSIS — Z13220 Encounter for screening for lipoid disorders: Secondary | ICD-10-CM | POA: Diagnosis not present

## 2023-01-05 DIAGNOSIS — Z6828 Body mass index (BMI) 28.0-28.9, adult: Secondary | ICD-10-CM | POA: Diagnosis not present

## 2023-01-05 DIAGNOSIS — Z7189 Other specified counseling: Secondary | ICD-10-CM | POA: Diagnosis not present

## 2023-01-05 DIAGNOSIS — Z113 Encounter for screening for infections with a predominantly sexual mode of transmission: Secondary | ICD-10-CM | POA: Diagnosis not present

## 2023-02-19 DIAGNOSIS — Z3046 Encounter for surveillance of implantable subdermal contraceptive: Secondary | ICD-10-CM | POA: Diagnosis not present

## 2023-02-19 DIAGNOSIS — Z113 Encounter for screening for infections with a predominantly sexual mode of transmission: Secondary | ICD-10-CM | POA: Diagnosis not present

## 2023-03-19 DIAGNOSIS — A64 Unspecified sexually transmitted disease: Secondary | ICD-10-CM | POA: Diagnosis not present

## 2023-03-19 DIAGNOSIS — Z113 Encounter for screening for infections with a predominantly sexual mode of transmission: Secondary | ICD-10-CM | POA: Diagnosis not present

## 2023-08-20 DIAGNOSIS — A64 Unspecified sexually transmitted disease: Secondary | ICD-10-CM | POA: Diagnosis not present

## 2023-08-20 DIAGNOSIS — Z113 Encounter for screening for infections with a predominantly sexual mode of transmission: Secondary | ICD-10-CM | POA: Diagnosis not present

## 2023-08-28 ENCOUNTER — Telehealth: Payer: Commercial Managed Care - PPO | Admitting: Nurse Practitioner

## 2023-08-28 DIAGNOSIS — U071 COVID-19: Secondary | ICD-10-CM | POA: Diagnosis not present

## 2023-08-28 NOTE — Progress Notes (Signed)
Virtual Visit Consent   Sierra Gilmore, you are scheduled for a virtual visit with a [facility] provider today. Just as with appointments in the office, your consent must be obtained to participate. Your consent will be active for this visit and any virtual visit you may have with one of our providers in the next 365 days. If you have a MyChart account, a copy of this consent can be sent to you electronically.  As this is a virtual visit, video technology does not allow for your provider to perform a traditional examination. This may limit your provider's ability to fully assess your condition. If your provider identifies any concerns that need to be evaluated in person or the need to arrange testing (such as labs, EKG, etc.), we will make arrangements to do so. Although advances in technology are sophisticated, we cannot ensure that it will always work on either your end or our end. If the connection with a video visit is poor, the visit may have to be switched to a telephone visit. With either a video or telephone visit, we are not always able to ensure that we have a secure connection.  By engaging in this virtual visit, you consent to the provision of healthcare and authorize for your insurance to be billed (if applicable) for the services provided during this visit. Depending on your insurance coverage, you may receive a charge related to this service.  I need to obtain your verbal consent now. Are you willing to proceed with your visit today? Sierra Gilmore has provided verbal consent on 08/28/2023 for a virtual visit (video or telephone). Viviano Simas, FNP  Date: 08/28/2023 5:19 PM  Virtual Visit via Video Note   I, Viviano Simas, connected with  Sierra Gilmore  ([id number], March 21, 2001) on 08/28/23 at  5:30 PM EST by a video-enabled telemedicine application and verified that I am speaking with the correct person using two identifiers.  Location: Patient: Virtual Visit Location Patient:  Home Provider: Virtual Visit Location Provider: Home Office   I discussed the limitations of evaluation and management by telemedicine and the availability of in person appointments. The patient expressed understanding and agreed to proceed.    History of Present Illness: Sierra Gilmore is a 23 y.o. 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  Resting comfortably  at home.  Head is normocephalic, atraumatic.  No labored breathing.  Speech is clear and coherent with logical content.  Patient is alert and oriented  at baseline.    Assessment and Plan:  1. COVID-19 (Primary)  No recent labs available in the past year, no complicated history of covid or high risk morbidities   Discussed option of molnupiravir vs continuing to manage mild symptoms with over the counter medications   Follow up if symptoms persist or with new concerns        Follow Up Instructions: I discussed the assessment and treatment plan with the patient. The patient  was provided an opportunity to ask questions and all were answered. The patient agreed with the plan and demonstrated an understanding of the instructions.  A copy of instructions were sent to the patient via MyChart unless otherwise noted below.    The patient was advised to call back or seek an in-person evaluation if the symptoms worsen or if the condition fails to improve as anticipated.    Viviano Simas, FNP

## 2023-10-10 DIAGNOSIS — M79645 Pain in left finger(s): Secondary | ICD-10-CM | POA: Diagnosis not present

## 2023-10-12 DIAGNOSIS — M79645 Pain in left finger(s): Secondary | ICD-10-CM | POA: Diagnosis not present

## 2023-10-18 DIAGNOSIS — S62512A Displaced fracture of proximal phalanx of left thumb, initial encounter for closed fracture: Secondary | ICD-10-CM | POA: Diagnosis not present

## 2023-10-23 DIAGNOSIS — G8918 Other acute postprocedural pain: Secondary | ICD-10-CM | POA: Diagnosis not present

## 2023-10-23 DIAGNOSIS — S53442A Ulnar collateral ligament sprain of left elbow, initial encounter: Secondary | ICD-10-CM | POA: Diagnosis not present

## 2023-10-23 DIAGNOSIS — S62512A Displaced fracture of proximal phalanx of left thumb, initial encounter for closed fracture: Secondary | ICD-10-CM | POA: Diagnosis not present

## 2023-11-05 DIAGNOSIS — M79645 Pain in left finger(s): Secondary | ICD-10-CM | POA: Diagnosis not present

## 2023-12-06 DIAGNOSIS — S62512D Displaced fracture of proximal phalanx of left thumb, subsequent encounter for fracture with routine healing: Secondary | ICD-10-CM | POA: Diagnosis not present

## 2023-12-10 DIAGNOSIS — M25542 Pain in joints of left hand: Secondary | ICD-10-CM | POA: Diagnosis not present

## 2023-12-17 DIAGNOSIS — M25542 Pain in joints of left hand: Secondary | ICD-10-CM | POA: Diagnosis not present

## 2023-12-17 DIAGNOSIS — S62512D Displaced fracture of proximal phalanx of left thumb, subsequent encounter for fracture with routine healing: Secondary | ICD-10-CM | POA: Diagnosis not present

## 2023-12-27 DIAGNOSIS — M25542 Pain in joints of left hand: Secondary | ICD-10-CM | POA: Diagnosis not present

## 2024-01-04 DIAGNOSIS — M25542 Pain in joints of left hand: Secondary | ICD-10-CM | POA: Diagnosis not present

## 2024-01-11 DIAGNOSIS — M25542 Pain in joints of left hand: Secondary | ICD-10-CM | POA: Diagnosis not present

## 2024-01-25 DIAGNOSIS — M25542 Pain in joints of left hand: Secondary | ICD-10-CM | POA: Diagnosis not present

## 2024-02-04 DIAGNOSIS — S62512D Displaced fracture of proximal phalanx of left thumb, subsequent encounter for fracture with routine healing: Secondary | ICD-10-CM | POA: Diagnosis not present
